# Patient Record
Sex: Female | Born: 1970 | Race: Black or African American | Hispanic: No | Marital: Single | Smoking: Never smoker
Health system: Southern US, Community
[De-identification: ages and names within clinical notes are randomized; demographics above are authoritative.]

## PROBLEM LIST (undated history)

## (undated) HISTORY — PX: REDUCTION MAMMAPLASTY: SUR839

---

## 2017-01-26 DIAGNOSIS — J301 Allergic rhinitis due to pollen: Secondary | ICD-10-CM | POA: Diagnosis not present

## 2017-01-26 DIAGNOSIS — G4700 Insomnia, unspecified: Secondary | ICD-10-CM | POA: Diagnosis not present

## 2017-01-26 DIAGNOSIS — G4720 Circadian rhythm sleep disorder, unspecified type: Secondary | ICD-10-CM | POA: Diagnosis not present

## 2017-07-21 DIAGNOSIS — Z1239 Encounter for other screening for malignant neoplasm of breast: Secondary | ICD-10-CM | POA: Diagnosis not present

## 2017-07-21 DIAGNOSIS — G4720 Circadian rhythm sleep disorder, unspecified type: Secondary | ICD-10-CM | POA: Diagnosis not present

## 2017-07-21 DIAGNOSIS — Z008 Encounter for other general examination: Secondary | ICD-10-CM | POA: Diagnosis not present

## 2017-08-26 DIAGNOSIS — H8303 Labyrinthitis, bilateral: Secondary | ICD-10-CM | POA: Diagnosis not present

## 2017-08-28 DIAGNOSIS — H6692 Otitis media, unspecified, left ear: Secondary | ICD-10-CM | POA: Diagnosis not present

## 2017-08-28 DIAGNOSIS — H9202 Otalgia, left ear: Secondary | ICD-10-CM | POA: Diagnosis not present

## 2017-08-28 DIAGNOSIS — Z833 Family history of diabetes mellitus: Secondary | ICD-10-CM | POA: Diagnosis not present

## 2017-08-28 DIAGNOSIS — Z862 Personal history of diseases of the blood and blood-forming organs and certain disorders involving the immune mechanism: Secondary | ICD-10-CM | POA: Diagnosis not present

## 2017-08-28 DIAGNOSIS — R42 Dizziness and giddiness: Secondary | ICD-10-CM | POA: Diagnosis not present

## 2017-09-10 DIAGNOSIS — H8143 Vertigo of central origin, bilateral: Secondary | ICD-10-CM | POA: Diagnosis not present

## 2017-09-10 DIAGNOSIS — G43909 Migraine, unspecified, not intractable, without status migrainosus: Secondary | ICD-10-CM | POA: Diagnosis not present

## 2017-11-26 DIAGNOSIS — M25552 Pain in left hip: Secondary | ICD-10-CM | POA: Diagnosis not present

## 2017-11-26 DIAGNOSIS — M542 Cervicalgia: Secondary | ICD-10-CM | POA: Diagnosis not present

## 2017-11-26 DIAGNOSIS — Z1211 Encounter for screening for malignant neoplasm of colon: Secondary | ICD-10-CM | POA: Diagnosis not present

## 2018-01-28 DIAGNOSIS — Z862 Personal history of diseases of the blood and blood-forming organs and certain disorders involving the immune mechanism: Secondary | ICD-10-CM | POA: Diagnosis not present

## 2018-01-28 DIAGNOSIS — R7989 Other specified abnormal findings of blood chemistry: Secondary | ICD-10-CM | POA: Diagnosis not present

## 2018-01-28 DIAGNOSIS — G245 Blepharospasm: Secondary | ICD-10-CM | POA: Diagnosis not present

## 2018-02-17 DIAGNOSIS — M25552 Pain in left hip: Secondary | ICD-10-CM | POA: Diagnosis not present

## 2018-02-17 DIAGNOSIS — M25562 Pain in left knee: Secondary | ICD-10-CM | POA: Diagnosis not present

## 2018-02-17 DIAGNOSIS — M7062 Trochanteric bursitis, left hip: Secondary | ICD-10-CM | POA: Diagnosis not present

## 2019-10-12 ENCOUNTER — Other Ambulatory Visit: Payer: Self-pay | Admitting: Family Medicine

## 2019-10-12 DIAGNOSIS — Z1231 Encounter for screening mammogram for malignant neoplasm of breast: Secondary | ICD-10-CM

## 2019-10-13 ENCOUNTER — Ambulatory Visit
Admission: RE | Admit: 2019-10-13 | Discharge: 2019-10-13 | Disposition: A | Discharge status: Home / Self Care | Payer: 59 | Source: Ambulatory Visit | Attending: Family Medicine | Admitting: Family Medicine

## 2019-10-13 ENCOUNTER — Other Ambulatory Visit: Payer: Self-pay

## 2019-10-13 DIAGNOSIS — Z1231 Encounter for screening mammogram for malignant neoplasm of breast: Secondary | ICD-10-CM

## 2020-05-29 ENCOUNTER — Ambulatory Visit: Payer: 59 | Admitting: Podiatry

## 2020-06-14 ENCOUNTER — Ambulatory Visit (INDEPENDENT_AMBULATORY_CARE_PROVIDER_SITE_OTHER): Payer: 59

## 2020-06-14 ENCOUNTER — Other Ambulatory Visit: Payer: Self-pay

## 2020-06-14 ENCOUNTER — Ambulatory Visit: Payer: 59 | Admitting: Podiatry

## 2020-06-14 ENCOUNTER — Encounter: Payer: Self-pay | Admitting: Podiatry

## 2020-06-14 VITALS — BP 122/76 | HR 64 | Temp 97.4°F | Resp 16

## 2020-06-14 DIAGNOSIS — M2022 Hallux rigidus, left foot: Secondary | ICD-10-CM

## 2020-06-14 DIAGNOSIS — M2012 Hallux valgus (acquired), left foot: Secondary | ICD-10-CM | POA: Diagnosis not present

## 2020-06-14 DIAGNOSIS — M2011 Hallux valgus (acquired), right foot: Secondary | ICD-10-CM | POA: Diagnosis not present

## 2020-06-14 NOTE — Progress Notes (Signed)
° °  Subjective:    Patient ID: Joyce Mitchell, female    DOB: 1971/01/15, 49 y.o.   MRN: [id number]  HPI    Review of Systems  All other systems reviewed and are negative.      Objective:   Physical Exam        Assessment & Plan:

## 2020-06-14 NOTE — Progress Notes (Signed)
Subjective:   Patient ID: Joyce Mitchell, female   DOB: 49 y.o.   MRN: [id number]   HPI Patient presents stating that she is getting discomfort in the big toe joint left and that she had surgery 7 years ago on both her big toe joints but they did not move the one on the left and that was given her trouble since.  States the right when had a screw which was removed and the left one has continued to give her trouble and it seems to be also sore inside the joint.  Patient does not smoke likes to be active   Review of Systems  All other systems reviewed and are negative.       Objective:  Physical Exam Vitals and nursing note reviewed.  Constitutional:      Appearance: She is well-developed.  Pulmonary:     Effort: Pulmonary effort is normal.  Musculoskeletal:        General: Normal range of motion.  Skin:    General: Skin is warm.  Neurological:     Mental Status: She is alert.     Neurovascular status intact muscle strength adequate range of motion within normal limits.  Patient is noted to have exquisite discomfort around the first MPJ left foot with fluid buildup around the joint surface and is noted to have slight prominence around metatarsal head itself.  The right does have better range of motion no crepitus no pain when I palpate    Assessment:  Probability that there is an elongation of the first metatarsal segment left with issues associated with this as it was not shortened during the initial surgery     Plan:  H&P x-rays reviewed condition discussed at great length.  I do think that there is probably an issue with the length of the metatarsal creating inflammation patient is experiencing and I do think we could consider a shortening osteotomy with removal of spurring and that this could solve her problem.  I did explain is a difficult problem she understands wants procedure will look at her schedule and will be scheduled for outpatient procedure and was encouraged to call  with questions and will be seen back in August for tentative surgery in September  X-ray indicates there is elongation of the first metatarsal segment left over right and there is possibility for slight narrowing of the joint left with no current bone spur formation

## 2020-06-14 NOTE — Patient Instructions (Addendum)
Bunion  A bunion is a bump on the base of the big toe that forms when the bones of the big toe joint move out of position. Bunions may be small at first, but they often get larger over time. They can make walking painful. What are the causes? A bunion may be caused by:  Wearing narrow or pointed shoes that force the big toe to press against the other toes.  Abnormal foot development that causes the foot to roll inward (pronate).  Changes in the foot that are caused by certain diseases, such as rheumatoid arthritis or polio.  A foot injury. What increases the risk? The following factors may make you more likely to develop this condition:  Wearing shoes that squeeze the toes together.  Having certain diseases, such as: ? Rheumatoid arthritis. ? Polio. ? Cerebral palsy.  Having family members who have bunions.  Being born with a foot deformity, such as flat feet or low arches.  Doing activities that put a lot of pressure on the feet, such as ballet dancing. What are the signs or symptoms? The main symptom of a bunion is a noticeable bump on the big toe. Other symptoms may include:  Pain.  Swelling around the big toe.  Redness and inflammation.  Thick or hardened skin on the big toe or between the toes.  Stiffness or loss of motion in the big toe.  Trouble with walking. How is this diagnosed? A bunion may be diagnosed based on your symptoms, medical history, and activities. You may have tests, such as:  X-rays. These allow your health care provider to check the position of the bones in your foot and look for damage to your joint. They also help your health care provider determine the severity of your bunion and the best way to treat it.  Joint aspiration. In this test, a sample of fluid is removed from the toe joint. This test may be done if you are in a lot of pain. It helps rule out diseases that cause painful swelling of the joints, such as arthritis. How is this  treated? Treatment depends on the severity of your symptoms. The goal of treatment is to relieve symptoms and prevent the bunion from getting worse. Your health care provider may recommend:  Wearing shoes that have a wide toe box.  Using bunion pads to cushion the affected area.  Taping your toes together to keep them in a normal position.  Placing a device inside your shoe (orthotics) to help reduce pressure on your toe joint.  Taking medicine to ease pain, inflammation, and swelling.  Applying heat or ice to the affected area.  Doing stretching exercises.  Surgery to remove scar tissue and move the toes back into their normal position. This treatment is rare. Follow these instructions at home: Managing pain, stiffness, and swelling   If directed, put ice on the painful area: ? Put ice in a plastic bag. ? Place a towel between your skin and the bag. ? Leave the ice on for 20 minutes, 2-3 times a day. Activity   If directed, apply heat to the affected area before you exercise. Use the heat source that your health care provider recommends, such as a moist heat pack or a heating pad. ? Place a towel between your skin and the heat source. ? Leave the heat on for 20-30 minutes. ? Remove the heat if your skin turns bright red. This is especially important if you are unable to feel pain,   heat, or cold. You may have a greater risk of getting burned.  Do exercises as told by your health care provider. General instructions  Support your toe joint with proper footwear, shoe padding, or taping as told by your health care provider.  Take over-the-counter and prescription medicines only as told by your health care provider.  Keep all follow-up visits as told by your health care provider. This is important. Contact a health care provider if your symptoms:  Get worse.  Do not improve in 2 weeks. Get help right away if you have:  Severe pain and trouble with walking. Summary  A  bunion is a bump on the base of the big toe that forms when the bones of the big toe joint move out of position.  Bunions can make walking painful.  Treatment depends on the severity of your symptoms.  Support your toe joint with proper footwear, shoe padding, or taping as told by your health care provider. This information is not intended to replace advice given to you by your health care provider. Make sure you discuss any questions you have with your health care provider. Document Revised: 05/31/2018 Document Reviewed: 04/06/2018 Elsevier Patient Education  2020 [employer]  Hallux Rigidus  Hallux rigidus is a type of joint pain or joint disease (arthritis) that affects your big toe (hallux). This condition involves the joint that connects the base of your big toe to the main part of your foot (metatarsophalangeal joint or MTP joint). This condition can cause your big toe to become stiff, painful, and difficult to move. Symptoms may get worse with movement or in cold or damp weather. The condition gets worse over time. What are the causes? This condition may be caused by having a foot that does not function the way that it should or that has an abnormal shape (structural deformity). These foot problems can run in families and may be passed down from parents to children (are hereditary). This condition can also be caused by:  Injury.  Overuse.  Certain inflammatory diseases, including gout and rheumatoid arthritis. What increases the risk? You are more likely to develop this condition if you have:  A foot bone (metatarsal) that is longer or higher than normal.  A family history of hallux rigidus.  Previously injured your big toe.  Feet that do not have a curve (arch) on the inner side of the foot. This may be called flat feet or fallen arches.  Ankles that turn in when you walk (pronation).  Rheumatoid arthritis or gout.  A job that requires you to stoop down often at  work. What are the signs or symptoms? Symptoms of this condition include:  Big toe pain.  Stiffness and difficulty moving the big toe.  Swelling of the toe and surrounding area.  Bone spurs. These are bony growths that can form on the joint of the big toe.  A limp. How is this diagnosed? This condition is diagnosed based on your medical history and a physical exam. You may also have X-rays. How is this treated? This condition is treated by:  Wearing roomy, comfortable shoes that have a large toe box.  Putting orthotic devices in your shoes.  Taking pain medicines.  Having physical therapy.  Icing the injured area.  Alternating between putting your foot in cold water and then in warm water. If your condition is severe, treatment may include:  Corticosteroid injections to relieve pain.  Surgery to remove bone spurs, fuse damaged bones together,   or replace the entire joint. Follow these instructions at home: Managing pain, stiffness, and swelling   Put your feet in cold water for 30 seconds, and then in warm water for 30 seconds. Alternate between the cold and warm water for 5 minutes. Do this several times a day or as told by your health care provider.  If directed, put ice on the injured area. ? Put ice in a plastic bag. ? Place a towel between your skin and the bag. ? Leave the ice on for 20 minutes, 2-3 times a day. General instructions  Take over-the-counter and prescription medicines only as told by your health care provider.  Do not wear high heels or other restrictive footwear. Wear comfortable, supportive shoes that have a large toe box.  Wear shoe inserts (orthotics) as told by your health care provider, if this applies.  Do foot exercises as instructed by your health care provider or a physical therapist.  Keep all follow-up visits as told by your health care provider. This is important. Contact a health care provider if:  You notice bone spurs or  growths on or around your big toe.  Your pain does not get better or it gets worse.  You have pain while resting.  You have pain in other parts of your body, such as your back, hip, or knee.  You start to limp. Summary  Hallux rigidus is a condition that makes your big toe become stiff, painful, and difficult to move.  It can be caused by injury, overuse, or inflammatory diseases.  This condition may be treated with ice, medicines, physical therapy, and surgery.  Do not wear high heels or other restrictive footwear. Wear comfortable, supportive shoes that have a large toe box. This information is not intended to replace advice given to you by your health care provider. Make sure you discuss any questions you have with your health care provider. Document Revised: 09/03/2018 Document Reviewed: 09/06/2018 Elsevier Patient Education  2020 [employer].

## 2020-09-07 ENCOUNTER — Ambulatory Visit: Payer: 59 | Admitting: Podiatry

## 2020-11-07 ENCOUNTER — Ambulatory Visit: Payer: 59 | Admitting: Podiatry

## 2020-11-15 ENCOUNTER — Other Ambulatory Visit: Payer: Self-pay

## 2020-11-15 ENCOUNTER — Ambulatory Visit: Payer: 59 | Admitting: Podiatry

## 2020-11-15 ENCOUNTER — Encounter: Payer: Self-pay | Admitting: Podiatry

## 2020-11-15 DIAGNOSIS — M2022 Hallux rigidus, left foot: Secondary | ICD-10-CM

## 2020-11-15 NOTE — Progress Notes (Signed)
Subjective:   Patient ID: Joyce Mitchell, female   DOB: 49 y.o.   MRN: [id number]   HPI Patient states her bunion is killing her and she needs to have it corrected.  States that she has been trying wider shoes she is tried so she is tried oral anti-inflammatories and has had previous injections without relief.  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  Conservative care has not been successful and she had had previous spur done years ago which did not help her.  I recommended a osteotomy to both reduce the ankle and shorten the first metatarsal with removal of spurring and I allowed her to read consent form going over all possible alternative treatments complications.  Patient wants surgery after extensive review signed consent form scheduled for outpatient procedure.  Patient is encouraged to call with questions understands there may be cartilage damage we may not be able to repair it completely and that this is revisional surgery from previous surgery of years ago.  She has air fracture walker dispensed with all instructions on usage and is encouraged to call with questions and understands total recovery can take 6 months  X-rays indicate elevation of the intermetatarsal angle approximate 15 degrees with elongated first metatarsal and narrowed first metatarsal but does have around the joint which gives me comfort that there is not cartilage damage currently

## 2020-11-16 ENCOUNTER — Telehealth: Payer: Self-pay | Admitting: Podiatry

## 2020-11-16 NOTE — Telephone Encounter (Addendum)
DOS: 11/20/2020  Procedure: [employer] Bi-Planar Lt [phone])  [employer] Effective From 12/09/2019 - 12/07/2020  Deductible: $600 with $600 met and $0 remaining. Out of Pocket: $3,000 with $965.09 met and $2,043.10 remaining. CoInsurance: 100% Copay: $200  [employer] Auth# [id number] valid from 11/20/2020 - 02/18/2021.

## 2020-11-19 ENCOUNTER — Telehealth: Payer: Self-pay

## 2020-11-19 MED ORDER — OXYCODONE-ACETAMINOPHEN 10-325 MG PO TABS: 1.0000 | ORAL_TABLET | ORAL | 0 refills | Status: DC | PRN

## 2020-11-19 MED ORDER — ONDANSETRON HCL 4 MG PO TABS: 4.0000 mg | ORAL_TABLET | Freq: Three times a day (TID) | ORAL | 0 refills | Status: DC | PRN

## 2020-11-19 NOTE — Addendum Note (Signed)
Addended by: Lenn Sink on: 11/19/2020 01:35 PM   Modules accepted: Orders

## 2020-11-19 NOTE — Telephone Encounter (Signed)
DOS 11/20/2020  [facility] BUNIONECTOMY LT - [id number]  [employer] EFFECTIVE DATE - 12/09/2019  PLAN DEDUCTIBLE - $600.00 W/ $0.00 REMAINING OUT OF POCKET - $3000.00 W/ $6681.59 REMAINING COPAY $200.00 COINSURANCE - 15%   NOTIFICATION/PRIOR AUTHORIZATION NUMBER CASE STATUS CASE STATUS REASON PRIMARY CARE PHYSICIAN [id number] Closed Case Was Managed And Is Now Complete - ADVANCE NOTIFY DATE/TIME ADMISSION NOTIFY DATE/TIME 11/16/2020 10:10 AM CST - COVERAGE STATUS OVERALL COVERAGE STATUS Covered/Approved 1-1 CODE DESCRIPTION COVERAGE STATUS DECISION DATE [facility] Coverage determination is reflected for the facility admission and is not a guarantee of payment for ongoing services. Covered/Approved 11/16/2020 1 [id number] Correction, hallux valgus (bunionectomy) more Covered/Approved 11/16/2020

## 2020-11-20 ENCOUNTER — Telehealth: Payer: Self-pay | Admitting: Podiatry

## 2020-11-20 DIAGNOSIS — M2012 Hallux valgus (acquired), left foot: Secondary | ICD-10-CM

## 2020-11-20 MED ORDER — ONDANSETRON HCL 4 MG PO TABS: 4.0000 mg | ORAL_TABLET | Freq: Three times a day (TID) | ORAL | 0 refills | Status: AC | PRN

## 2020-11-20 MED ORDER — OXYCODONE-ACETAMINOPHEN 10-325 MG PO TABS: 1.0000 | ORAL_TABLET | ORAL | 0 refills | Status: AC | PRN

## 2020-11-20 NOTE — Telephone Encounter (Signed)
The patient's husband said the pharmacy did not receive the medications that were sent in yesterday. Can you please resend?

## 2020-11-21 ENCOUNTER — Other Ambulatory Visit: Payer: Self-pay | Admitting: Podiatry

## 2020-11-21 NOTE — Telephone Encounter (Signed)
Is at the [facility]. Was sent in

## 2020-11-26 ENCOUNTER — Encounter: Payer: Self-pay | Admitting: Podiatry

## 2020-11-26 ENCOUNTER — Ambulatory Visit (INDEPENDENT_AMBULATORY_CARE_PROVIDER_SITE_OTHER): Payer: 59 | Admitting: Podiatry

## 2020-11-26 ENCOUNTER — Ambulatory Visit (INDEPENDENT_AMBULATORY_CARE_PROVIDER_SITE_OTHER): Payer: 59

## 2020-11-26 ENCOUNTER — Other Ambulatory Visit: Payer: Self-pay

## 2020-11-26 DIAGNOSIS — M2022 Hallux rigidus, left foot: Secondary | ICD-10-CM

## 2020-11-26 DIAGNOSIS — M21619 Bunion of unspecified foot: Secondary | ICD-10-CM

## 2020-11-26 DIAGNOSIS — M21612 Bunion of left foot: Secondary | ICD-10-CM

## 2020-11-26 MED ORDER — HYDROCODONE-ACETAMINOPHEN 10-325 MG PO TABS: 1.0000 | ORAL_TABLET | Freq: Three times a day (TID) | ORAL | 0 refills | Status: AC | PRN

## 2020-11-28 NOTE — Progress Notes (Signed)
Subjective:   Patient ID: Joyce Mitchell, female   DOB: 49 y.o.   MRN: [id number]   HPI Patient presents stating that she is doing well with minimal discomfort and is pleased so far with surgery   ROS      Objective:  Physical Exam  Neurovascular status intact negative [name]' sign noted patient's left foot healing well wound edges well coapted with good alignment noted     Assessment:  Doing well post foot surgery     Plan:  H&P x-ray reviewed continue immobilization compression elevation with gradual increase in range of motion exercises and reappoint to recheck. Also have recommended physical therapy to mobilize the joint surface  X-rays indicate osteotomies healing well fixation in place joint looks clear and clean

## 2020-12-19 ENCOUNTER — Encounter: Payer: 59 | Admitting: Podiatry

## 2020-12-26 ENCOUNTER — Encounter: Payer: 59 | Admitting: Podiatry

## 2020-12-28 ENCOUNTER — Other Ambulatory Visit: Payer: Self-pay | Admitting: Family Medicine

## 2020-12-28 DIAGNOSIS — Z1231 Encounter for screening mammogram for malignant neoplasm of breast: Secondary | ICD-10-CM

## 2020-12-31 ENCOUNTER — Encounter: Payer: Self-pay | Admitting: Podiatry

## 2020-12-31 ENCOUNTER — Ambulatory Visit (INDEPENDENT_AMBULATORY_CARE_PROVIDER_SITE_OTHER): Payer: 59

## 2020-12-31 ENCOUNTER — Ambulatory Visit (INDEPENDENT_AMBULATORY_CARE_PROVIDER_SITE_OTHER): Payer: 59 | Admitting: Podiatry

## 2020-12-31 ENCOUNTER — Other Ambulatory Visit: Payer: Self-pay

## 2020-12-31 ENCOUNTER — Ambulatory Visit
Admission: RE | Admit: 2020-12-31 | Discharge: 2020-12-31 | Disposition: A | Discharge status: Home / Self Care | Payer: 59 | Source: Ambulatory Visit | Attending: Family Medicine | Admitting: Family Medicine

## 2020-12-31 DIAGNOSIS — M21619 Bunion of unspecified foot: Secondary | ICD-10-CM

## 2020-12-31 DIAGNOSIS — M21612 Bunion of left foot: Secondary | ICD-10-CM

## 2020-12-31 DIAGNOSIS — Z1231 Encounter for screening mammogram for malignant neoplasm of breast: Secondary | ICD-10-CM

## 2020-12-31 MED ORDER — DICLOFENAC SODIUM 75 MG PO TBEC: 75.0000 mg | DELAYED_RELEASE_TABLET | Freq: Two times a day (BID) | ORAL | 2 refills | Status: AC

## 2021-01-01 NOTE — Progress Notes (Signed)
Subjective:   Patient ID: Joyce Mitchell, female   DOB: 50 y.o.   MRN: [id number]   HPI Patient states overall she is doing very well she knows she is good to need orthotics and she is very pleased so far with her results.  Patient does not think though she will be able to return to steel toe shoes in the near future due to inability to wear even soft shoes with comfort   ROS      Objective:  Physical Exam  Neurovascular status intact negative [name]' sign noted range of motion improved left with no pain when I move the big toe with swelling still noted around the first MPJ normal for her.  Postop     Assessment:  Overall doing well with normal healing good motion but still inability to wear steel toe shoes which I would expect at this point     Plan:  Patient will hopefully be able to get into steel toe shoes in the next 4 to 6 weeks but it is going to take time and I advised on continued elevation compression and range of motion exercises.  Reappoint for [place] to recheck again approximate 4 weeks or earlier if needed  X-rays indicate osteotomies healing well fixation in place joint congruence

## 2021-01-10 ENCOUNTER — Encounter: Payer: Self-pay | Admitting: Podiatry

## 2021-01-30 ENCOUNTER — Telehealth: Payer: Self-pay | Admitting: Podiatry

## 2021-01-30 NOTE — Telephone Encounter (Signed)
Joyce Mitchell was released to rtw on 01/28/2021. She has to wear steel toe shoes and was wondering if she could start off slowly with working and build up. Can she work 6 hours a day for 2 weeks 8hours a day for 2weeks  10 hours a day for 2 weeks  Then go back to 12 hours shifts

## 2021-01-31 NOTE — Telephone Encounter (Signed)
That would be fine 

## 2021-02-04 ENCOUNTER — Ambulatory Visit (INDEPENDENT_AMBULATORY_CARE_PROVIDER_SITE_OTHER): Payer: 59 | Admitting: Podiatry

## 2021-02-04 ENCOUNTER — Encounter: Payer: Self-pay | Admitting: Podiatry

## 2021-02-04 ENCOUNTER — Ambulatory Visit (INDEPENDENT_AMBULATORY_CARE_PROVIDER_SITE_OTHER): Payer: 59

## 2021-02-04 ENCOUNTER — Other Ambulatory Visit: Payer: Self-pay

## 2021-02-04 DIAGNOSIS — M2022 Hallux rigidus, left foot: Secondary | ICD-10-CM | POA: Diagnosis not present

## 2021-02-04 DIAGNOSIS — M779 Enthesopathy, unspecified: Secondary | ICD-10-CM | POA: Diagnosis not present

## 2021-02-04 NOTE — Progress Notes (Signed)
Subjective:   Patient ID: Joyce Mitchell, female   DOB: 50 y.o.   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 on   ROS      Objective:  Physical Exam  Neurovascular status intact with patient found to have moderate depression of the arch bilateral with inflammation and has a well healed surgical site left first metatarsal secondary to bunion correction     Assessment:  Doing well from structural bunion correction left with mild swelling still noted which may make steel toe shoes difficult to be getting along with tenderness situation     Plan:  Reviewed x-rays and went ahead and casted for functional orthotics today reviewed tendinitis discussed the theory between trying to lift up the arch and patient will be seen back when ready       X-rays indicate the osteotomy is healing well fixation in place moderate depression of the arch

## 2021-02-05 ENCOUNTER — Encounter: Payer: Self-pay | Admitting: Podiatry

## 2021-08-25 IMAGING — MG MM DIGITAL SCREENING BILAT W/ TOMO AND CAD
8 series · 8 of 24 positions shown · non-contrast
Comparison: Previous exam(s).

CLINICAL DATA: Screening. History of bilateral reduction
mammoplasty.

EXAM:
DIGITAL SCREENING BILATERAL MAMMOGRAM WITH TOMO AND CAD

[L CC synth-2D]
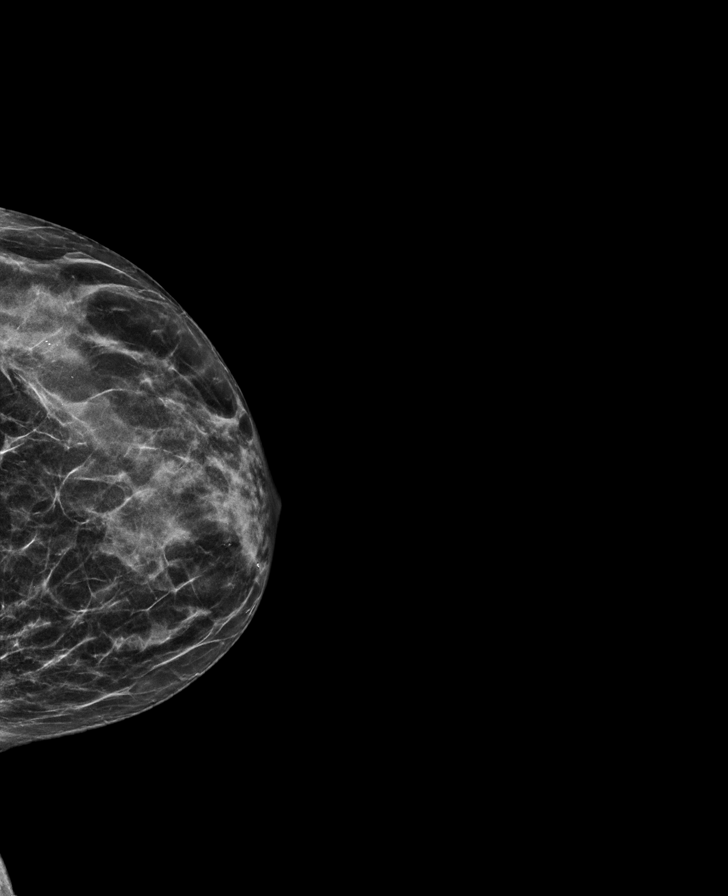

[R MLO synth-2D]
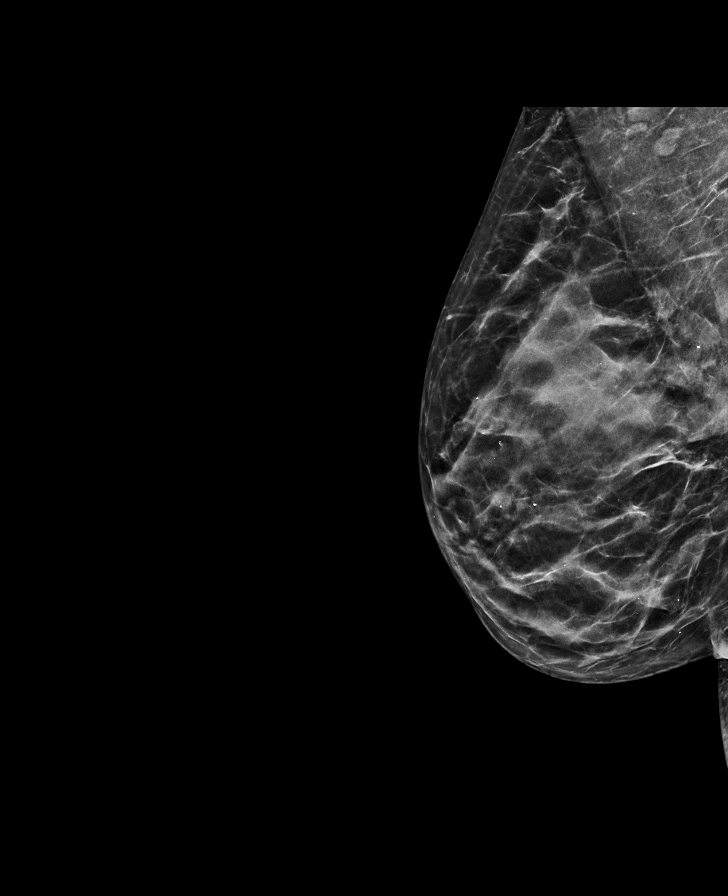

[L MLO synth-2D]
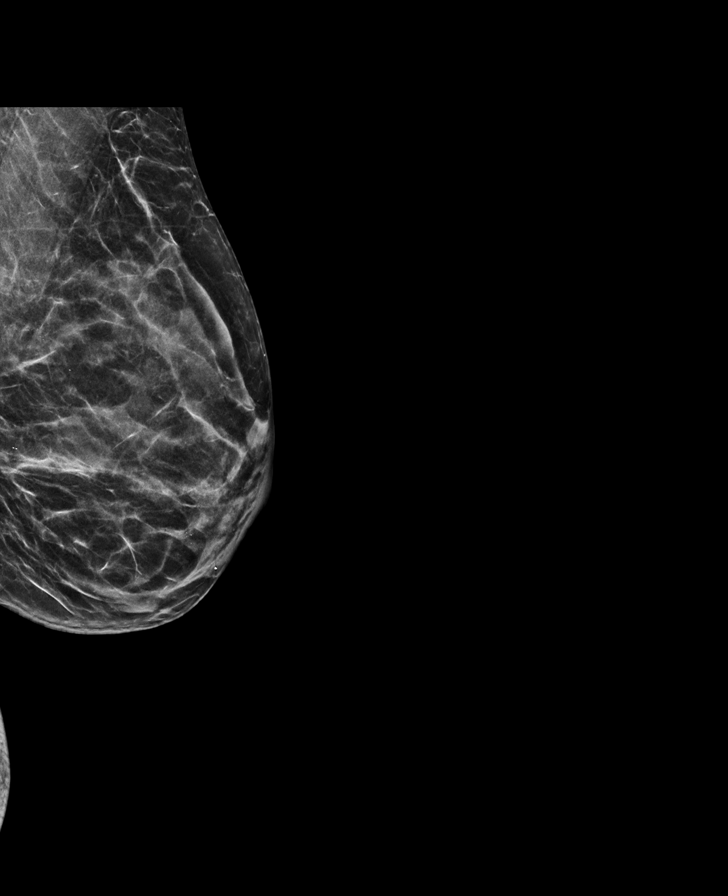

[R CC synth-2D]
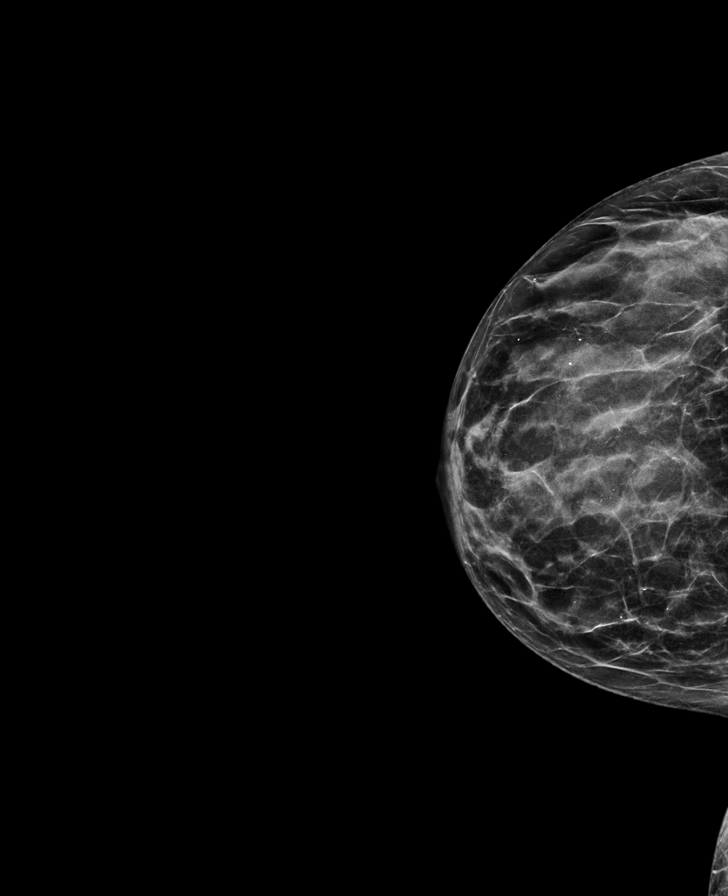

[R CC tomo · tomo slice 27/52.0]
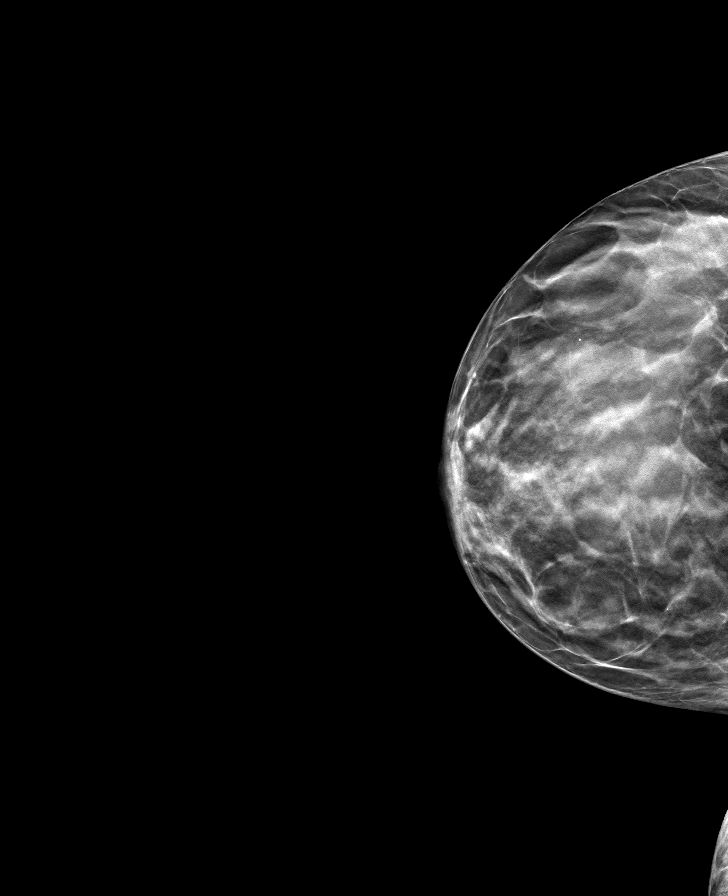

[R MLO tomo · tomo slice 29/58.0]
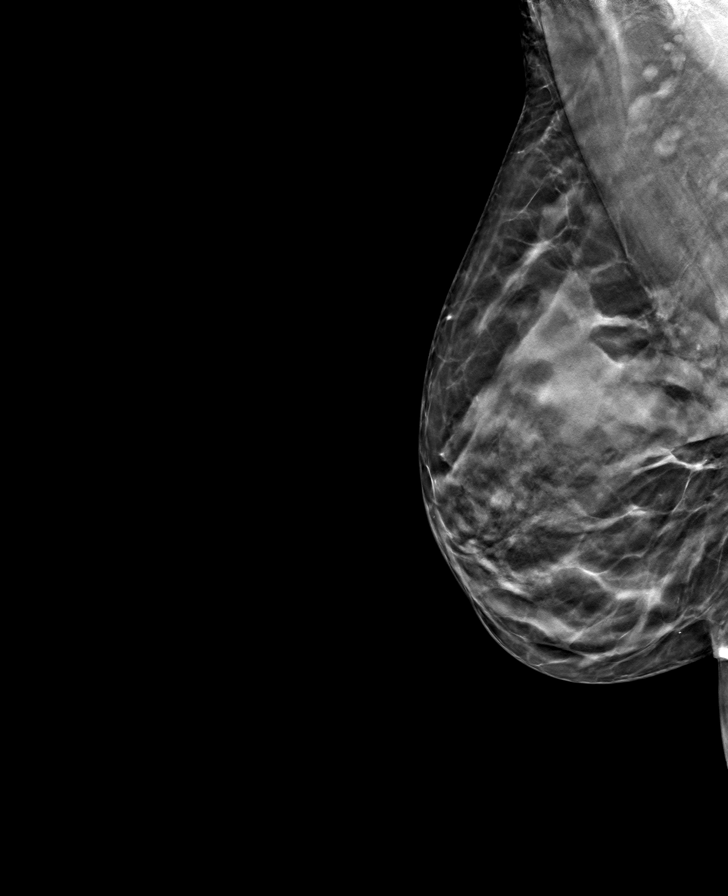

[L MLO tomo · tomo slice 26/51.0]
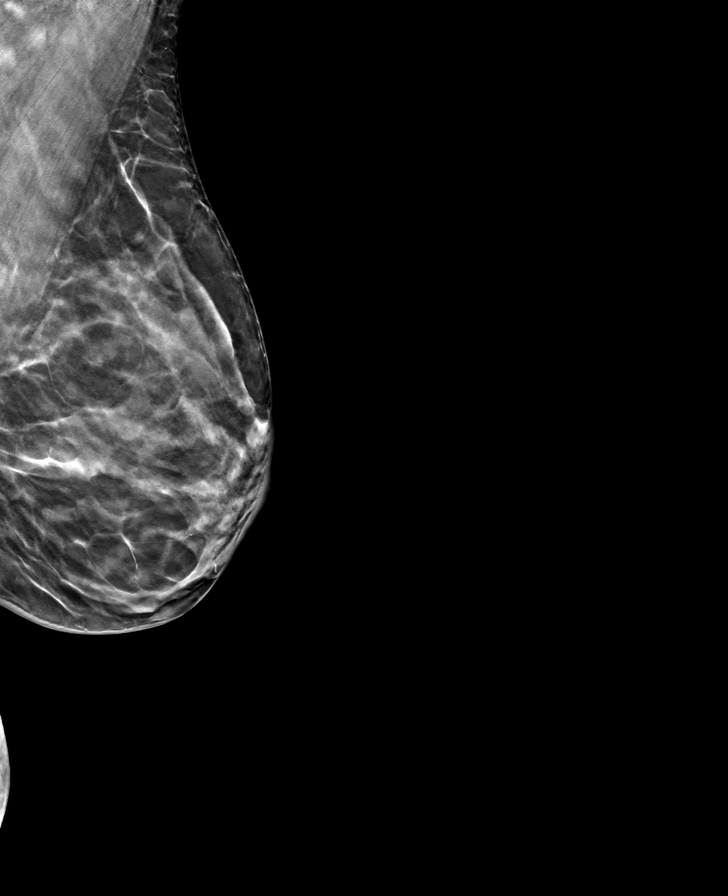

[L CC tomo · tomo slice 25/48.0]
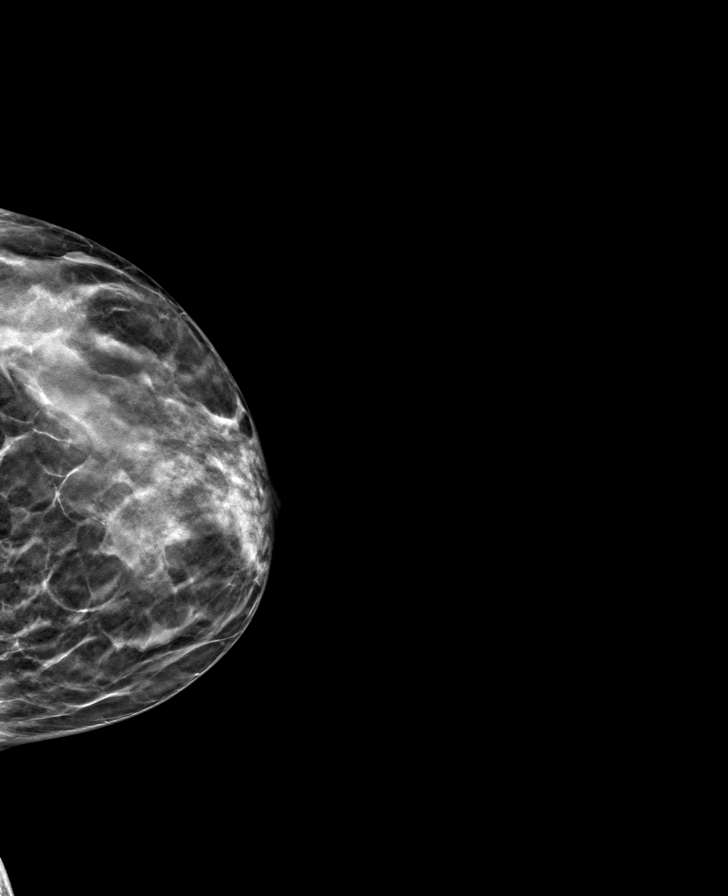

[8 of 24 positions shown; findings below may reference images not displayed]

ACR Breast Density Category c: The breast tissue is heterogeneously
dense, which may obscure small masses.
FINDINGS: There are no findings suspicious for malignancy. The images were
evaluated with computer-aided detection.
IMPRESSION: No mammographic evidence of malignancy. A result letter of this
screening mammogram will be mailed directly to the patient.

RECOMMENDATION:
Screening mammogram in one year. (Code:EB-[id number])

BI-RADS CATEGORY  1: Negative.

## 2022-01-28 ENCOUNTER — Telehealth: Payer: Self-pay | Admitting: Podiatry

## 2022-01-30 ENCOUNTER — Telehealth: Payer: Self-pay | Admitting: Podiatry

## 2022-01-30 NOTE — Telephone Encounter (Signed)
error 

## 2022-01-30 NOTE — Telephone Encounter (Signed)
Called patient,unable to lvm to schedule an appointment

## 2022-04-17 ENCOUNTER — Encounter: Payer: Self-pay | Admitting: Podiatry
